# Patient Record
Sex: Female | Born: 2005 | Race: Black or African American | Hispanic: No | Marital: Single
Health system: Southern US, Community
[De-identification: ages and names within clinical notes are randomized; demographics above are authoritative.]

## PROBLEM LIST (undated history)

## (undated) HISTORY — PX: TONSILLECTOMY: SUR1361

---
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
## 2024-01-01 ENCOUNTER — Other Ambulatory Visit: Payer: Self-pay

## 2024-01-01 ENCOUNTER — Emergency Department (HOSPITAL_BASED_OUTPATIENT_CLINIC_OR_DEPARTMENT_OTHER): Payer: MEDICAID | Admitting: Radiology

## 2024-01-01 ENCOUNTER — Emergency Department (HOSPITAL_BASED_OUTPATIENT_CLINIC_OR_DEPARTMENT_OTHER)
Admission: EM | Admit: 2024-01-01 | Discharge: 2024-01-01 | Disposition: A | Discharge status: Home / Self Care | Payer: MEDICAID | Attending: Emergency Medicine | Admitting: Emergency Medicine

## 2024-01-01 ENCOUNTER — Emergency Department (HOSPITAL_BASED_OUTPATIENT_CLINIC_OR_DEPARTMENT_OTHER): Payer: MEDICAID

## 2024-01-01 ENCOUNTER — Encounter (HOSPITAL_BASED_OUTPATIENT_CLINIC_OR_DEPARTMENT_OTHER): Payer: Self-pay

## 2024-01-01 DIAGNOSIS — Y92410 Unspecified street and highway as the place of occurrence of the external cause: Secondary | ICD-10-CM | POA: Insufficient documentation

## 2024-01-01 DIAGNOSIS — S39012A Strain of muscle, fascia and tendon of lower back, initial encounter: Secondary | ICD-10-CM | POA: Diagnosis not present

## 2024-01-01 DIAGNOSIS — M545 Low back pain, unspecified: Secondary | ICD-10-CM | POA: Diagnosis present

## 2024-01-01 LAB — PREGNANCY, URINE: Preg Test, Ur: NEGATIVE

## 2024-01-01 MED ORDER — IBUPROFEN 800 MG PO TABS
800.0000 mg | ORAL_TABLET | Freq: Once | ORAL | Status: AC
  Administered 2024-01-01: 800 mg via ORAL
  Filled 2024-01-01: qty 1

## 2024-01-01 MED ORDER — CYCLOBENZAPRINE HCL 5 MG PO TABS
5.0000 mg | ORAL_TABLET | Freq: Once | ORAL | Status: AC
  Administered 2024-01-01: 5 mg via ORAL
  Filled 2024-01-01: qty 1

## 2024-01-01 MED ORDER — CYCLOBENZAPRINE HCL 5 MG PO TABS: 5.0000 mg | ORAL_TABLET | Freq: Three times a day (TID) | ORAL | 0 refills | Status: AC | PRN
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
## 2024-01-01 NOTE — Discharge Instructions (Addendum)
Please follow-up with your primary care doctor, take Tylenol and ibuprofen for pain control, you could also take some Flexeril to help with the pain control.  Do not use this when operating heavy machinery or making major medical decisions.  Return if you have any loss of bowel, bladder, fever or chills
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
